# Patient Record
Sex: Male | Born: 1985 | Hispanic: Yes | Marital: Single | Smoking: Never smoker
Health system: Southern US, Community
[De-identification: ages and names within clinical notes are randomized; demographics above are authoritative.]

---

## 2008-02-16 ENCOUNTER — Inpatient Hospital Stay (HOSPITAL_COMMUNITY): Admission: EM | Admit: 2008-02-16 | Discharge: 2008-02-26 | Payer: Self-pay | Admitting: Emergency Medicine

## 2008-02-16 ENCOUNTER — Ambulatory Visit: Payer: Self-pay | Admitting: Internal Medicine

## 2008-02-16 ENCOUNTER — Ambulatory Visit: Payer: Self-pay | Admitting: Pulmonary Disease

## 2008-03-11 ENCOUNTER — Ambulatory Visit: Payer: Self-pay | Admitting: Pulmonary Disease

## 2008-03-11 DIAGNOSIS — J1100 Influenza due to unidentified influenza virus with unspecified type of pneumonia: Secondary | ICD-10-CM

## 2009-05-09 IMAGING — CR DG CHEST 2V
2 series · 2 of 2 positions shown · non-contrast
Comparison: 02/23/2008 and CT of 02/16/2008.

CLINICAL DATA: Follow up pneumonia

CHEST - 2 VIEW

[w chest pa]
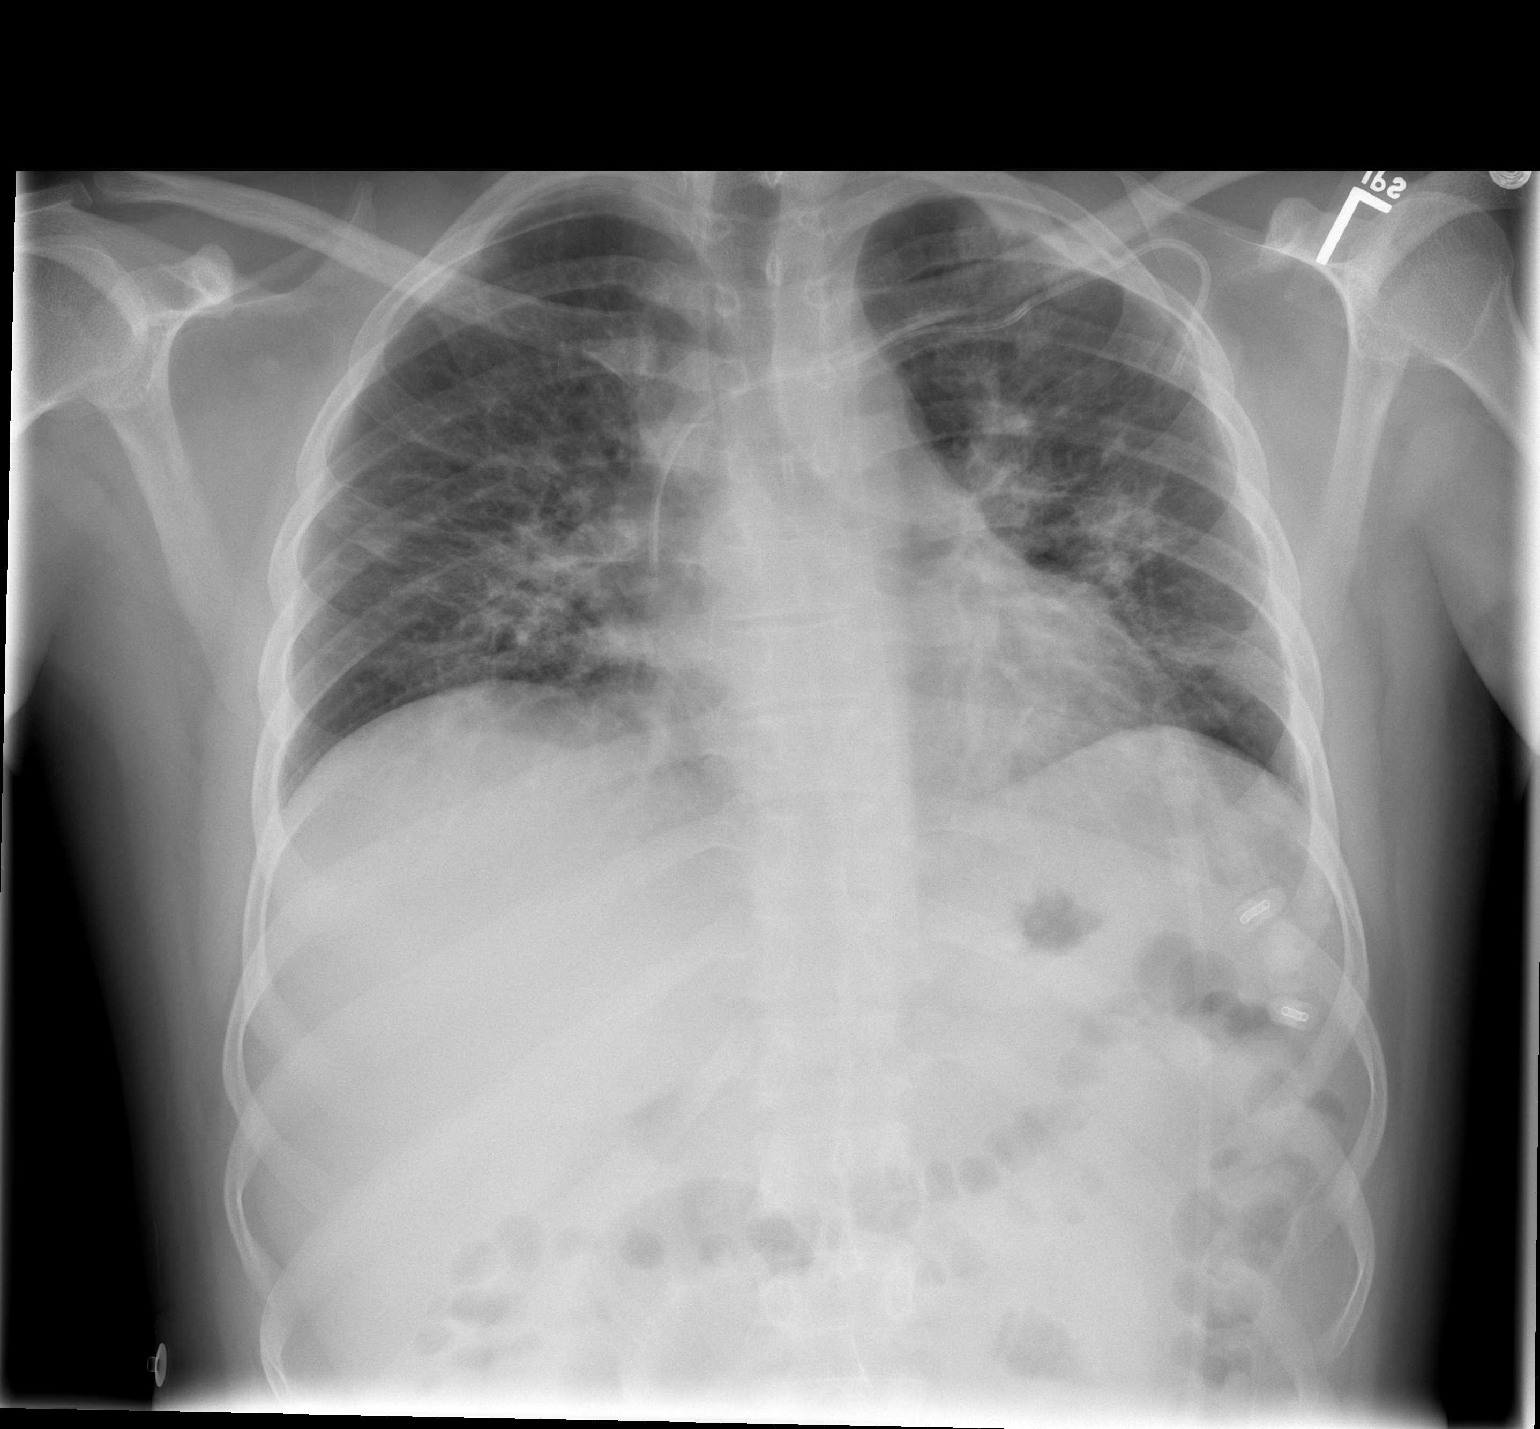

[w chest lat]
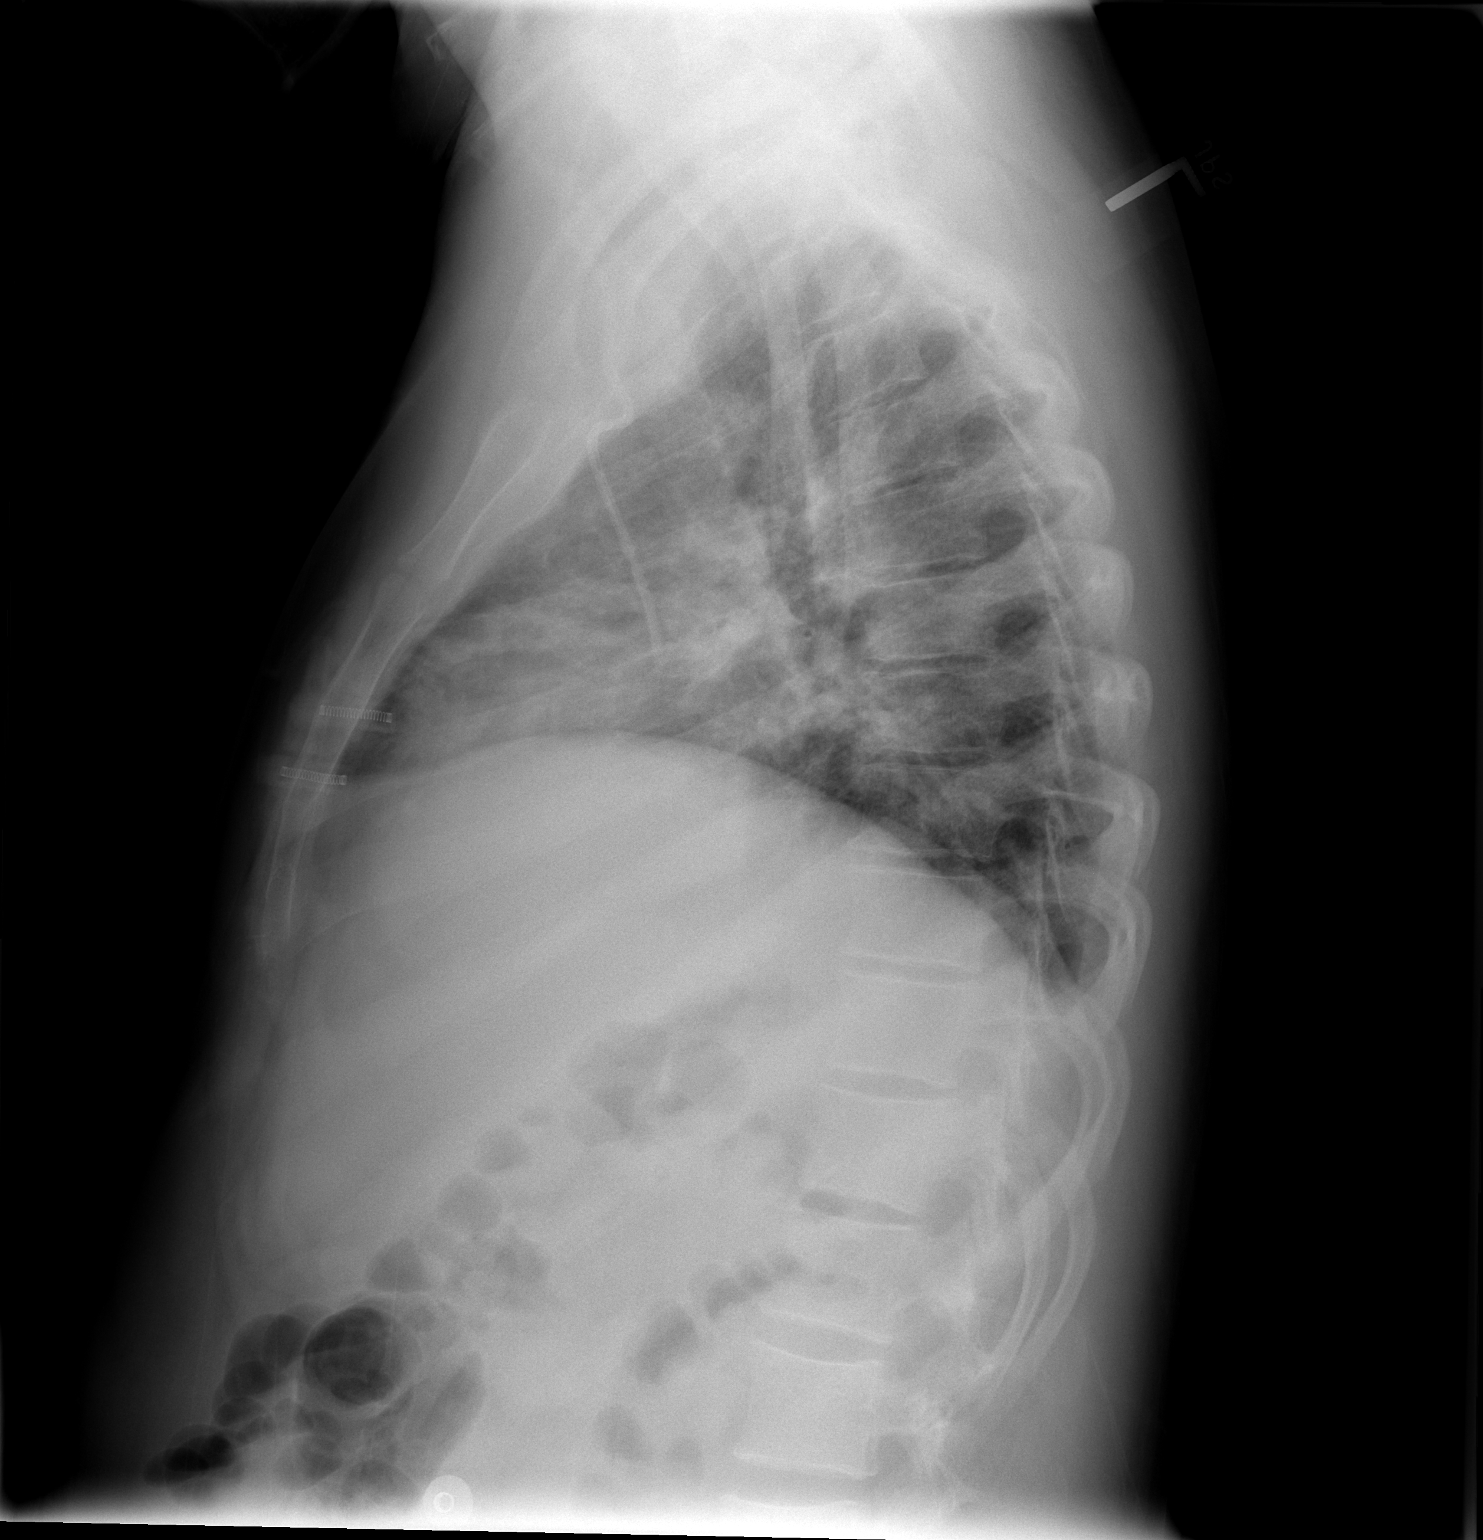

[2 of 2 positions shown; findings below may reference images not displayed]

FINDINGS: Left-sided subclavian line terminates at mid SVC. Midline
trachea. Normal heart size for level of inspiration.  No pleural
effusion or pneumothorax. Left apical pleural thickening.  Improved
aeration.  Perihilar interstitial and airspace disease persists.
IMPRESSION: 1.  Improved aeration with persistent perihilar interstitial and
airspace disease.  Given the appearance on prior CT, favored
etiologies include atypical infectious processes.  Correlate with
patient's immune status.

## 2010-09-14 NOTE — H&P (Signed)
NAME:  Micheal Ortiz, PETROVICH NO.:  [id number]   MEDICAL RECORD NO.:  [id number]          PATIENT TYPE:  EMS   LOCATION:  MAJO                         FACILITY:  [facility]   PHYSICIAN:  Eduard Clos, MDDATE OF BIRTH:  29-Aug-1985   DATE OF ADMISSION:  02/15/2008  DATE OF DISCHARGE:                              HISTORY & PHYSICAL   PRIMARY CARE PHYSICIAN:  Unassigned.   History obtained through his wife, acting as Engineer, structural.   CHIEF COMPLAINT:  Shortness of breath.   HISTORY OF PRESENT ILLNESS:  A 25 year old male with no significant past  medical history presented to the ER with increasing shortness of breath  over the last 10 days.  Patient has been having initially coughing  spells with nausea and vomiting with eventually becoming more and more  short of breath over the last three days.  Patient also has been having  cough with productive sputum.  In the ER, the patient did have some  hemoptysis.  In the ER when the patient was admitted, he was found to  have a chest x-ray with bilateral infiltrates.  Patient has been  admitted for further management.  Patient also has been experiencing  fever and chills with abdominal pain, particularly in the right lower  quadrant.  Denies any diarrhea.  Denies any chest pain.  Patient at this  time is on 4 liters of oxygen, maintaining sats of more than 90.  He  denies any dysuria, weakness of limbs, or loss of consciousness.   PAST MEDICAL HISTORY:  Nothing significant.   PAST SURGICAL HISTORY:  None.   MEDICATIONS PRIOR TO ADMISSION:  Patient was recently taking Duraflu.   ALLERGIES:  No known drug allergies.   SOCIAL HISTORY:  Patient denies smoking cigarettes.  Drinks alcohol  occasionally.  Denies any drug abuse.   REVIEW OF SYSTEMS:  As per the history of present illness.  Nothing else  significant.   PHYSICAL EXAMINATION:  Patient examined at bedside.  Not in acute  distress, although appears mildly  short of breath.  VITAL SIGNS:  Blood pressure is 198/78, pulse 98 per minute, temperature  102.7 maximum, respirations 20 per minute, O2 sat 97% on 4 liters.  HEENT:  Anicteric.  No pallor.  CHEST:  Bilateral air entry present bilaterally.  Coarse crepitation.  HEART:  S1 and S2 heard.  ABDOMEN:  Soft.  Nontender.  Bowel sounds heard.  No guarding, no  rigidity.  CNS:  Awake, alert and oriented to time, place, and person.  No focal  deficits.  EXTREMITIES:  Peripheral pulses.  No edema.   LABS:  CBC:  WBC is 6.4, hemoglobin 16, hematocrit 45.8, platelets 94.  Basic metabolic panel:  Sodium 133, potassium 3.1, chloride 100, carbon  dioxide 24, glucose 143, BUN 8, creatinine 0.64, calcium 8.4.  Glucose  is pending.   CHEST X-RAY:  Patchy bilateral air space opacities, concerning for  multifocal bacterial pneumonia or viral pneumonia.  Suggest radiographic  followup.   ASSESSMENT:  1. Acute respiratory failure secondary to pneumonia.  2. Bilateral pneumonia.  3. Hemoptysis.  4. Nausea and  vomiting with abdominal pain.   PLAN:  Admit patient to step-down unit for closer monitoring.  Will get  an ABG.  Start patient on IV antibiotics.  Will get a CT of the chest,  abdomen, and pelvis with contrast.  Place PPD.  Sputum for AFB.  Respiratory and droplet isolation.  Will also place the patient on  Tamiflu.  Further recommendations as patient's condition evolves.  Patient probably will need a pulmonary consult.      Eduard Clos, MD  Electronically Signed     ANK/MEDQ  D:  02/16/2008  T:  02/16/2008  Job:  [id number]

## 2010-09-17 NOTE — Discharge Summary (Signed)
NAMESELAH, ZELMAN             ACCOUNT NO.:  [id number]   MEDICAL RECORD NO.:  [id number]          PATIENT TYPE:  INP   LOCATION:  4728                         FACILITY:  [facility]   PHYSICIAN:  Charlaine Dalton. Sherene Sires, MD, FCCPDATE OF BIRTH:  06/08/1985   DATE OF ADMISSION:  02/15/2008  DATE OF DISCHARGE:  02/26/2008                               DISCHARGE SUMMARY   DISCHARGE DIAGNOSES:  1. Acute respiratory failure, requiring ventilatory support.  2. Agitation.  3. Pneumonia.  4. Hypokalemia and hyponatremia.  5. Diarrhea, Clostridium difficile negative.   HISTORY OF PRESENT ILLNESS:  Micheal Ortiz is a 25 year old Hispanic male  with no significant past medical history.  He presented to the emergency  department on February 15, 2008 with increasing shortness of breath over  10 days.  He was initially having coughing spells with nausea and  vomiting and eventually becoming more and more short of breath over the  last 72 hours prior to admission.  He was also noted to have productive  sputum that was noted to be purulent in nature.  He was admitted for  further evaluation and treatment.  Originally he was admitted by the  [facility] service and pulmonary critical care assumed the care once he  required orotracheal intubation with mechanical ventilatory support.   LABORATORY DATA:  WBC 8.4, hemoglobin 12.6, hematocrit 37.3, platelet  count 111,000.  His admitting WBC was 12.6, hemoglobin 11.8, hematocrit  34.  Platelets 381,000.  ESR 2.  INR 1.0, PTT 28.  Sodium reached a  peak of 149, potassium 3.5, chloride 115, CO2 28, glucose 169, BUN 10.  Creatinine 1.07.   HOSPITAL COURSE:  1. Acute respiratory failure.  Note that he had a history of asthma      and initially noted to have wheezing.  He required orotracheal      intubation on February 16, 2008 with successful weaning of      mechanical ventilatory support until October 22.  He had a presumed      underlying pneumonia although his  H1N1 was negative.  AFB smear was      negative.  He completed seven days of antimicrobial therapy with      Zosyn.  He was initially placed on vancomycin and Zosyn and      Zithromax and ceftriaxone,  but he completed Zosyn, vancomycin and      ceftriaxone and Zithromax empiric rx.  He also had a left IJ      subclavian central venous line placed on October 17 with subsequent      removal; placed on October 17, removal on October 25.  He reached      maximal hospital benefit by February 26, 2008 and was discharged      home.  2. Pneumonia.  Again organism was not specified.  3. Hypokalemia which was resolved with treatment.  4. Hyponatremia which was resolved with treatment.  5. Diarrhea.  He was Clostridium difficile negative.  There was      question if it was from Protonix.  He was changed to Pepcid with  resolution.   DISCHARGE MEDICATIONS:  1. Symbicort 164.5 two puffs a.m. and p.m.  2. Magic Mouth Wash two tablespoons swish and gargle q.i.d.   FOLLOW UP:  He has a followup appointment with Dr. Cyril Mourning November  10.   DIET:  Heart-healthy diet.   DISPOSITION ON DISCHARGE:  Improved.      Devra Dopp, MSN, ACNP      Charlaine Dalton. Sherene Sires, MD, [facility]  Electronically Signed    SM/MEDQ  D:  05/06/2008  T:  05/06/2008  Job:  [id number]

## 2011-02-01 LAB — GLUCOSE, CAPILLARY
Glucose-Capillary: 110 mg/dL — ABNORMAL HIGH (ref 70–99)
Glucose-Capillary: 111 mg/dL — ABNORMAL HIGH (ref 70–99)
Glucose-Capillary: 120 mg/dL — ABNORMAL HIGH (ref 70–99)
Glucose-Capillary: 122 mg/dL — ABNORMAL HIGH (ref 70–99)
Glucose-Capillary: 123 mg/dL — ABNORMAL HIGH (ref 70–99)
Glucose-Capillary: 138 mg/dL — ABNORMAL HIGH (ref 70–99)
Glucose-Capillary: 142 mg/dL — ABNORMAL HIGH (ref 70–99)
Glucose-Capillary: 143 mg/dL — ABNORMAL HIGH (ref 70–99)
Glucose-Capillary: 145 mg/dL — ABNORMAL HIGH (ref 70–99)
Glucose-Capillary: 149 mg/dL — ABNORMAL HIGH (ref 70–99)
Glucose-Capillary: 150 mg/dL — ABNORMAL HIGH (ref 70–99)
Glucose-Capillary: 163 mg/dL — ABNORMAL HIGH (ref 70–99)
Glucose-Capillary: 165 mg/dL — ABNORMAL HIGH (ref 70–99)
Glucose-Capillary: 172 mg/dL — ABNORMAL HIGH (ref 70–99)
Glucose-Capillary: 176 mg/dL — ABNORMAL HIGH (ref 70–99)
Glucose-Capillary: 196 mg/dL — ABNORMAL HIGH (ref 70–99)
Glucose-Capillary: 196 mg/dL — ABNORMAL HIGH (ref 70–99)
Glucose-Capillary: 220 mg/dL — ABNORMAL HIGH (ref 70–99)
Glucose-Capillary: 220 mg/dL — ABNORMAL HIGH (ref 70–99)
Glucose-Capillary: 74 mg/dL (ref 70–99)

## 2011-02-01 LAB — BLOOD GAS, ARTERIAL
Acid-Base Excess: 0.1 mmol/L (ref 0.0–2.0)
Acid-base deficit: 2.7 mmol/L — ABNORMAL HIGH (ref 0.0–2.0)
Acid-base deficit: 3.7 mmol/L — ABNORMAL HIGH (ref 0.0–2.0)
Acid-base deficit: 4.5 mmol/L — ABNORMAL HIGH (ref 0.0–2.0)
Acid-base deficit: 5.3 mmol/L — ABNORMAL HIGH (ref 0.0–2.0)
Bicarbonate: 20.5 mEq/L (ref 20.0–24.0)
Bicarbonate: 20.9 mEq/L (ref 20.0–24.0)
Bicarbonate: 21.1 mEq/L (ref 20.0–24.0)
Bicarbonate: 21.7 mEq/L (ref 20.0–24.0)
Bicarbonate: 24.2 mEq/L — ABNORMAL HIGH (ref 20.0–24.0)
Bicarbonate: 26.8 mEq/L — ABNORMAL HIGH (ref 20.0–24.0)
Drawn by: 129711
Drawn by: 13898
Drawn by: 290171
FIO2: 0.3 %
FIO2: 0.3 %
FIO2: 0.4 %
FIO2: 0.4 %
FIO2: 1 %
FIO2: 100 %
MECHVT: 450 mL
MECHVT: 450 mL
MECHVT: 450 mL
Mode: POSITIVE
O2 Saturation: 94.9 %
O2 Saturation: 98.8 %
O2 Saturation: 99 %
PEEP: 5 cmH2O
Patient temperature: 100.2
Patient temperature: 98.6
Patient temperature: 98.6
Patient temperature: 98.6
Pressure support: 12 cmH2O
RATE: 16 resp/min
RATE: 20 resp/min
TCO2: 21.8 mmol/L (ref 0–100)
TCO2: 22.3 mmol/L (ref 0–100)
TCO2: 23.1 mmol/L (ref 0–100)
TCO2: 27.5 mmol/L (ref 0–100)
TCO2: 28 mmol/L (ref 0–100)
pCO2 arterial: 32 mmHg — ABNORMAL LOW (ref 35.0–45.0)
pCO2 arterial: 34.6 mmHg — ABNORMAL LOW (ref 35.0–45.0)
pCO2 arterial: 35.2 mmHg (ref 35.0–45.0)
pCO2 arterial: 38.7 mmHg (ref 35.0–45.0)
pCO2 arterial: 39.7 mmHg (ref 35.0–45.0)
pCO2 arterial: 41.7 mmHg (ref 35.0–45.0)
pCO2 arterial: 42.8 mmHg (ref 35.0–45.0)
pH, Arterial: 7.327 — ABNORMAL LOW (ref 7.350–7.450)
pH, Arterial: 7.329 — ABNORMAL LOW (ref 7.350–7.450)
pH, Arterial: 7.362 (ref 7.350–7.450)
pH, Arterial: 7.437 (ref 7.350–7.450)
pH, Arterial: 7.444 (ref 7.350–7.450)
pH, Arterial: 7.446 (ref 7.350–7.450)
pH, Arterial: 7.497 — ABNORMAL HIGH (ref 7.350–7.450)
pO2, Arterial: 102 mmHg — ABNORMAL HIGH (ref 80.0–100.0)
pO2, Arterial: 302 mmHg — ABNORMAL HIGH (ref 80.0–100.0)
pO2, Arterial: 69.1 mmHg — ABNORMAL LOW (ref 80.0–100.0)

## 2011-02-01 LAB — BASIC METABOLIC PANEL
BUN: 10 mg/dL (ref 6–23)
BUN: 10 mg/dL (ref 6–23)
BUN: 11 mg/dL (ref 6–23)
BUN: 9 mg/dL (ref 6–23)
CO2: 22 mEq/L (ref 19–32)
CO2: 22 mEq/L (ref 19–32)
CO2: 24 mEq/L (ref 19–32)
CO2: 25 mEq/L (ref 19–32)
CO2: 27 mEq/L (ref 19–32)
CO2: 28 mEq/L (ref 19–32)
Calcium: 7.5 mg/dL — ABNORMAL LOW (ref 8.4–10.5)
Calcium: 7.7 mg/dL — ABNORMAL LOW (ref 8.4–10.5)
Calcium: 8.4 mg/dL (ref 8.4–10.5)
Calcium: 8.4 mg/dL (ref 8.4–10.5)
Calcium: 8.4 mg/dL (ref 8.4–10.5)
Chloride: 105 mEq/L (ref 96–112)
Chloride: 105 mEq/L (ref 96–112)
Chloride: 109 mEq/L (ref 96–112)
Chloride: 111 mEq/L (ref 96–112)
Chloride: 115 mEq/L — ABNORMAL HIGH (ref 96–112)
Creatinine, Ser: 0.61 mg/dL (ref 0.4–1.5)
Creatinine, Ser: 0.85 mg/dL (ref 0.4–1.5)
Creatinine, Ser: 1.01 mg/dL (ref 0.4–1.5)
GFR calc Af Amer: >60 mL/min (ref >60)
GFR calc Af Amer: >60 mL/min (ref >60)
GFR calc Af Amer: >60 mL/min (ref >60)
GFR calc Af Amer: >60 mL/min (ref >60)
GFR calc Af Amer: >60 mL/min (ref >60)
GFR calc Af Amer: >60 mL/min (ref >60)
GFR calc non Af Amer: >60 mL/min (ref >60)
GFR calc non Af Amer: >60 mL/min (ref >60)
GFR calc non Af Amer: >60 mL/min (ref >60)
Glucose, Bld: 114 mg/dL — ABNORMAL HIGH (ref 70–99)
Glucose, Bld: 143 mg/dL — ABNORMAL HIGH (ref 70–99)
Glucose, Bld: 152 mg/dL — ABNORMAL HIGH (ref 70–99)
Glucose, Bld: 169 mg/dL — ABNORMAL HIGH (ref 70–99)
Potassium: 3.1 mEq/L — ABNORMAL LOW (ref 3.5–5.1)
Potassium: 3.3 mEq/L — ABNORMAL LOW (ref 3.5–5.1)
Potassium: 3.3 mEq/L — ABNORMAL LOW (ref 3.5–5.1)
Potassium: 3.5 mEq/L (ref 3.5–5.1)
Potassium: 3.6 mEq/L (ref 3.5–5.1)
Potassium: 4.3 mEq/L (ref 3.5–5.1)
Sodium: 141 mEq/L (ref 135–145)
Sodium: 142 mEq/L (ref 135–145)
Sodium: 147 mEq/L — ABNORMAL HIGH (ref 135–145)

## 2011-02-01 LAB — CBC
HCT: 33.7 % — ABNORMAL LOW (ref 39.0–52.0)
HCT: 34.3 % — ABNORMAL LOW (ref 39.0–52.0)
HCT: 35.9 % — ABNORMAL LOW (ref 39.0–52.0)
HCT: 36.2 % — ABNORMAL LOW (ref 39.0–52.0)
HCT: 43 % (ref 39.0–52.0)
HCT: 45.8 % (ref 39.0–52.0)
Hemoglobin: 11.8 g/dL — ABNORMAL LOW (ref 13.0–17.0)
Hemoglobin: 11.8 g/dL — ABNORMAL LOW (ref 13.0–17.0)
Hemoglobin: 12.3 g/dL — ABNORMAL LOW (ref 13.0–17.0)
Hemoglobin: 12.4 g/dL — ABNORMAL LOW (ref 13.0–17.0)
Hemoglobin: 16 g/dL (ref 13.0–17.0)
MCHC: 33.5 g/dL (ref 30.0–36.0)
MCHC: 33.6 g/dL (ref 30.0–36.0)
MCHC: 34 g/dL (ref 30.0–36.0)
MCHC: 34.6 g/dL (ref 30.0–36.0)
MCHC: 34.7 g/dL (ref 30.0–36.0)
MCV: 87.1 fL (ref 78.0–100.0)
MCV: 87.4 fL (ref 78.0–100.0)
MCV: 87.6 fL (ref 78.0–100.0)
MCV: 87.6 fL (ref 78.0–100.0)
MCV: 88.4 fL (ref 78.0–100.0)
MCV: 88.8 fL (ref 78.0–100.0)
Platelets: 111 10*3/uL — ABNORMAL LOW (ref 150–400)
Platelets: 218 10*3/uL (ref 150–400)
Platelets: 346 10*3/uL (ref 150–400)
Platelets: 368 10*3/uL (ref 150–400)
Platelets: 96 10*3/uL — ABNORMAL LOW (ref 150–400)
RBC: 3.81 MIL/uL — ABNORMAL LOW (ref 4.22–5.81)
RBC: 3.89 MIL/uL — ABNORMAL LOW (ref 4.22–5.81)
RBC: 4.1 MIL/uL — ABNORMAL LOW (ref 4.22–5.81)
RBC: 4.12 MIL/uL — ABNORMAL LOW (ref 4.22–5.81)
RBC: 4.23 MIL/uL (ref 4.22–5.81)
RDW: 12.5 % (ref 11.5–15.5)
RDW: 12.5 % (ref 11.5–15.5)
RDW: 13.4 % (ref 11.5–15.5)
RDW: 13.5 % (ref 11.5–15.5)
RDW: 13.6 % (ref 11.5–15.5)
WBC: 10.9 10*3/uL — ABNORMAL HIGH (ref 4.0–10.5)
WBC: 15.5 10*3/uL — ABNORMAL HIGH (ref 4.0–10.5)
WBC: 6.4 10*3/uL (ref 4.0–10.5)
WBC: 8 10*3/uL (ref 4.0–10.5)
WBC: 8.9 10*3/uL (ref 4.0–10.5)

## 2011-02-01 LAB — URINALYSIS, ROUTINE W REFLEX MICROSCOPIC
Bilirubin Urine: NEGATIVE
Ketones, ur: 40 mg/dL — AB
Nitrite: NEGATIVE
Protein, ur: >300 mg/dL — AB
Specific Gravity, Urine: 1.034 — ABNORMAL HIGH (ref 1.005–1.030)
Urobilinogen, UA: 0.2 mg/dL (ref 0.0–1.0)

## 2011-02-01 LAB — URINE MICROSCOPIC-ADD ON

## 2011-02-01 LAB — URINE CULTURE
Colony Count: NO GROWTH
Culture: NO GROWTH
Special Requests: NEGATIVE

## 2011-02-01 LAB — PROTIME-INR
INR: 1 (ref 0.00–1.49)
Prothrombin Time: 13.4 seconds (ref 11.6–15.2)

## 2011-02-01 LAB — DIFFERENTIAL
Basophils Relative: 0 % (ref 0–1)
Blasts: 0 %
Lymphocytes Relative: 10 % — ABNORMAL LOW (ref 12–46)
Lymphocytes Relative: 14 % (ref 12–46)
Monocytes Absolute: 0.2 10*3/uL (ref 0.1–1.0)
Monocytes Relative: 3 % (ref 3–12)
Myelocytes: 0 %
Neutro Abs: 5.4 10*3/uL (ref 1.7–7.7)
Promyelocytes Absolute: 0 %
nRBC: 0 /100 WBC

## 2011-02-01 LAB — CULTURE, BLOOD (ROUTINE X 2)
Culture: NO GROWTH
Culture: NO GROWTH
Culture: NO GROWTH

## 2011-02-01 LAB — CLOSTRIDIUM DIFFICILE EIA: C difficile Toxins A+B, EIA: NEGATIVE

## 2011-02-01 LAB — CULTURE, RESPIRATORY W GRAM STAIN

## 2011-02-01 LAB — AFB CULTURE WITH SMEAR (NOT AT ARMC)
Acid Fast Smear: NONE SEEN
Acid Fast Smear: NONE SEEN
Acid Fast Smear: NONE SEEN

## 2011-02-01 LAB — COMPREHENSIVE METABOLIC PANEL
Albumin: 3 g/dL — ABNORMAL LOW (ref 3.5–5.2)
Alkaline Phosphatase: 71 U/L (ref 39–117)
BUN: 5 mg/dL — ABNORMAL LOW (ref 6–23)
Potassium: 4.1 mEq/L (ref 3.5–5.1)
Total Protein: 5.9 g/dL — ABNORMAL LOW (ref 6.0–8.3)

## 2011-02-01 LAB — CARDIAC PANEL(CRET KIN+CKTOT+MB+TROPI)
CK, MB: 1.4 ng/mL (ref 0.3–4.0)
Relative Index: 0.6 (ref 0.0–2.5)
Troponin I: 0.01 ng/mL (ref 0.00–0.06)

## 2011-02-01 LAB — PHOSPHORUS: Phosphorus: 2.8 mg/dL (ref 2.3–4.6)

## 2011-02-01 LAB — P CARINII SMEAR DFA: Pneumocystis carinii DFA: NEGATIVE

## 2011-02-01 LAB — VANCOMYCIN, TROUGH: Vancomycin Tr: 5.4 ug/mL — ABNORMAL LOW (ref 10.0–20.0)

## 2011-02-01 LAB — POCT I-STAT 3, ART BLOOD GAS (G3+)
Patient temperature: 98.8
pCO2 arterial: 36 mmHg (ref 35.0–45.0)

## 2011-02-01 LAB — H1N1 SCREEN (PCR): H1N1 Virus Scrn: NOT DETECTED

## 2011-02-01 LAB — VIRUS CULTURE: Preliminary Culture: NEGATIVE

## 2011-02-01 LAB — HIV ANTIBODY (ROUTINE TESTING W REFLEX): HIV: NONREACTIVE

## 2011-02-01 LAB — APTT: aPTT: 28 seconds (ref 24–37)

## 2011-02-01 LAB — MAGNESIUM: Magnesium: 1.9 mg/dL (ref 1.5–2.5)

## 2017-05-02 ENCOUNTER — Other Ambulatory Visit: Payer: Self-pay

## 2017-05-02 ENCOUNTER — Encounter (HOSPITAL_COMMUNITY): Payer: Self-pay | Admitting: Emergency Medicine

## 2017-05-02 ENCOUNTER — Ambulatory Visit (INDEPENDENT_AMBULATORY_CARE_PROVIDER_SITE_OTHER): Payer: Self-pay

## 2017-05-02 ENCOUNTER — Ambulatory Visit (HOSPITAL_COMMUNITY)
Admission: EM | Admit: 2017-05-02 | Discharge: 2017-05-02 | Disposition: A | Discharge status: Home / Self Care | Payer: Self-pay | Attending: Family Medicine | Admitting: Family Medicine

## 2017-05-02 DIAGNOSIS — W19XXXA Unspecified fall, initial encounter: Secondary | ICD-10-CM

## 2017-05-02 DIAGNOSIS — M5489 Other dorsalgia: Secondary | ICD-10-CM

## 2017-05-02 DIAGNOSIS — M533 Sacrococcygeal disorders, not elsewhere classified: Secondary | ICD-10-CM

## 2017-05-02 DIAGNOSIS — W010XXA Fall on same level from slipping, tripping and stumbling without subsequent striking against object, initial encounter: Secondary | ICD-10-CM

## 2017-05-02 NOTE — ED Triage Notes (Signed)
Fell Sunday night.  Was cleaning floor with soap, slipped and fell on buttocks.  Pain in coccyx area.  No pain into either leg.  No issues with urination.  No bladder or bowel incontinence.

## 2017-05-02 NOTE — Discharge Instructions (Addendum)
Continue Ibuprofen 800 mg cada 8 horas con comida. Tambien [place] Tylenol [phone] tambien.  Puede [place] una almuada de dona para centarse.  Si su sintomas no mejoran en 1-2 semanas, regrese por favor o sus sintomas empeoran.

## 2017-05-02 NOTE — ED Provider Notes (Signed)
[facility]    CSN: [id number] Arrival date & time: 05/02/17  1704     History   Chief Complaint Chief Complaint  Patient presents with  . Fall    HPI Interpretation via Maggy. Mahki Spikes is a 32 y.o. male presenting after a fall with tail bone pain. He works in [employer] and slipped and fell after the floor was cleaned, he fell onto his tailbone. This happened Sunday night, yesterday morning he had difficulty getting out of bed due to pain. Worse with sitting and laying on sofa, better when standing up, no problems with walking. Taking Ibuprofen and Flanax, improved providing some relief. Urination and defecation without issue. Mild cramping in right leg, pins and needle sensation in bottom of feet.   HPI  History reviewed. No pertinent past medical history.  Patient Active Problem List   Diagnosis Date Noted  . INFLUENZA W/PNEUMONIA 03/11/2008    History reviewed. No pertinent surgical history.     Home Medications    Prior to Admission medications   Medication Sig Start Date End Date Taking? Authorizing Provider  ibuprofen (ADVIL,MOTRIN) 200 MG tablet Take 200 mg by mouth every 6 (six) hours as needed.   Yes [provider]  Naproxen-Liniment [facility] PAIN RELIEF [facility]) by Combination route.   Yes [provider]  NON FORMULARY    Yes [provider]    Family History Family History  Problem Relation Age of Onset  . Healthy Mother     Social History Social History   Tobacco Use  . Smoking status: Never Smoker  Substance Use Topics  . Alcohol use: Yes  . Drug use: No     Allergies   Patient has no known allergies.   Review of Systems Review of Systems  Respiratory: Negative for shortness of breath.   Cardiovascular: Negative for chest pain.  Gastrointestinal: Negative for abdominal pain and constipation.  Genitourinary: Negative for difficulty urinating.  Musculoskeletal: Positive for back pain and  myalgias. Negative for gait problem and neck pain.  Skin: Negative for wound.  Neurological: Negative for dizziness, weakness, light-headedness and headaches.     Physical Exam Triage Vital Signs ED Triage Vitals  Enc Vitals Group     BP 05/02/17 1802 (!) 153/104     Pulse Rate 05/02/17 1802 72     Resp 05/02/17 1802 18     Temp 05/02/17 1802 98.4 F (36.9 C)     Temp Source 05/02/17 1802 Oral     SpO2 05/02/17 1802 100 %     Weight --      Height --      Head Circumference --      Peak Flow --      Pain Score 05/02/17 1757 9     Pain Loc --      Pain Edu? --      Excl. in GC? --    No data found.  Updated Vital Signs BP (!) 153/104 (BP Location: Left Arm)   Pulse 72   Temp 98.4 F (36.9 C) (Oral)   Resp 18   SpO2 100%    Physical Exam  Constitutional: He appears well-developed and well-nourished.  Patient standing to avoid pain with sitting  HENT:  Head: Normocephalic and atraumatic.  Eyes: Conjunctivae are normal.  Neck: Neck supple.  Cardiovascular: Normal rate and regular rhythm.  No murmur heard. Pulmonary/Chest: Effort normal. No respiratory distress.  Abdominal: Soft. He exhibits no distension.  Musculoskeletal: He exhibits  tenderness. He exhibits no edema.  Tenderness with light palpation to sacral area. No tenderness to lumbar muscles/spine.  Neurological: He is alert.  Skin: Skin is warm and dry.  Psychiatric: He has a normal mood and affect.  Nursing note and vitals reviewed.    UC Treatments / Results  Labs (all labs ordered are listed, but only abnormal results are displayed) Labs Reviewed - No data to display  EKG  EKG Interpretation None       Radiology Dg Sacrum/coccyx  Result Date: 05/02/2017 CLINICAL DATA:  Fall injuring tail bone. EXAM: SACRUM AND COCCYX - 2+ VIEW COMPARISON:  02/21/08 FINDINGS: There is no evidence of fracture or other focal bone lesions. IMPRESSION: Negative. Electronically Signed   By: Signa Kellaylor  Stroud M.D.    On: 05/02/2017 18:52    Procedures Procedures (including critical care time)  Medications Ordered in UC Medications - No data to display   Initial Impression / Assessment and Plan / UC Course  I have reviewed the triage vital signs and the nursing notes.  Pertinent labs & imaging results that were available during my care of the patient were reviewed by me and considered in my medical decision making (see chart for details).    No fracture on imaging, advised to continue anti-inflammatories, may use donut pillow for pain with sitting to relieve pressure. Advised symptoms often worsen in first 2-3 days then followed by improvement. Discussed strict return precautions. Patient verbalized understanding and is agreeable with plan.    Final Clinical Impressions(s) / UC Diagnoses   Final diagnoses:  Fall, initial encounter  Tail bone pain    ED Discharge Orders    None       Controlled Substance Prescriptions [place] Controlled Substance Registry consulted? Not Applicable   Lew DawesWieters, Tami Barren C, [place] JerseyPA-C 05/02/17 1945

## 2018-07-14 IMAGING — DX DG SACRUM/COCCYX 2+V
3 series · 3 of 3 positions shown · non-contrast
Comparison: 02/21/08

CLINICAL DATA: Fall injuring tail bone.

EXAM:
SACRUM AND COCCYX - 2+ VIEW

[coccyx ap]
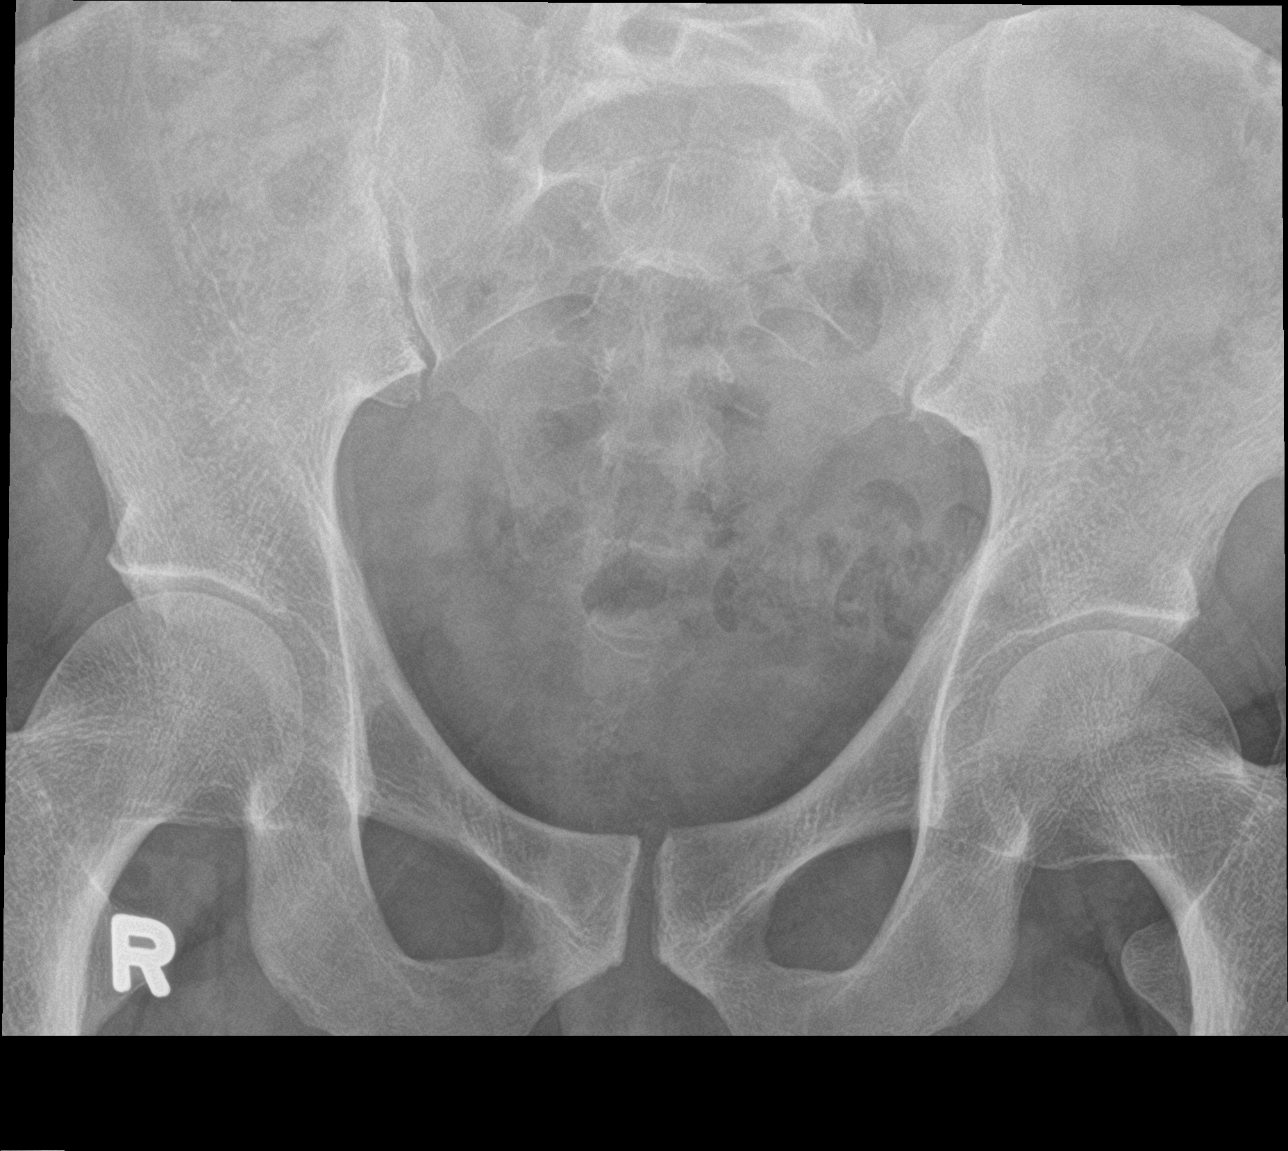

[sacrum ap]
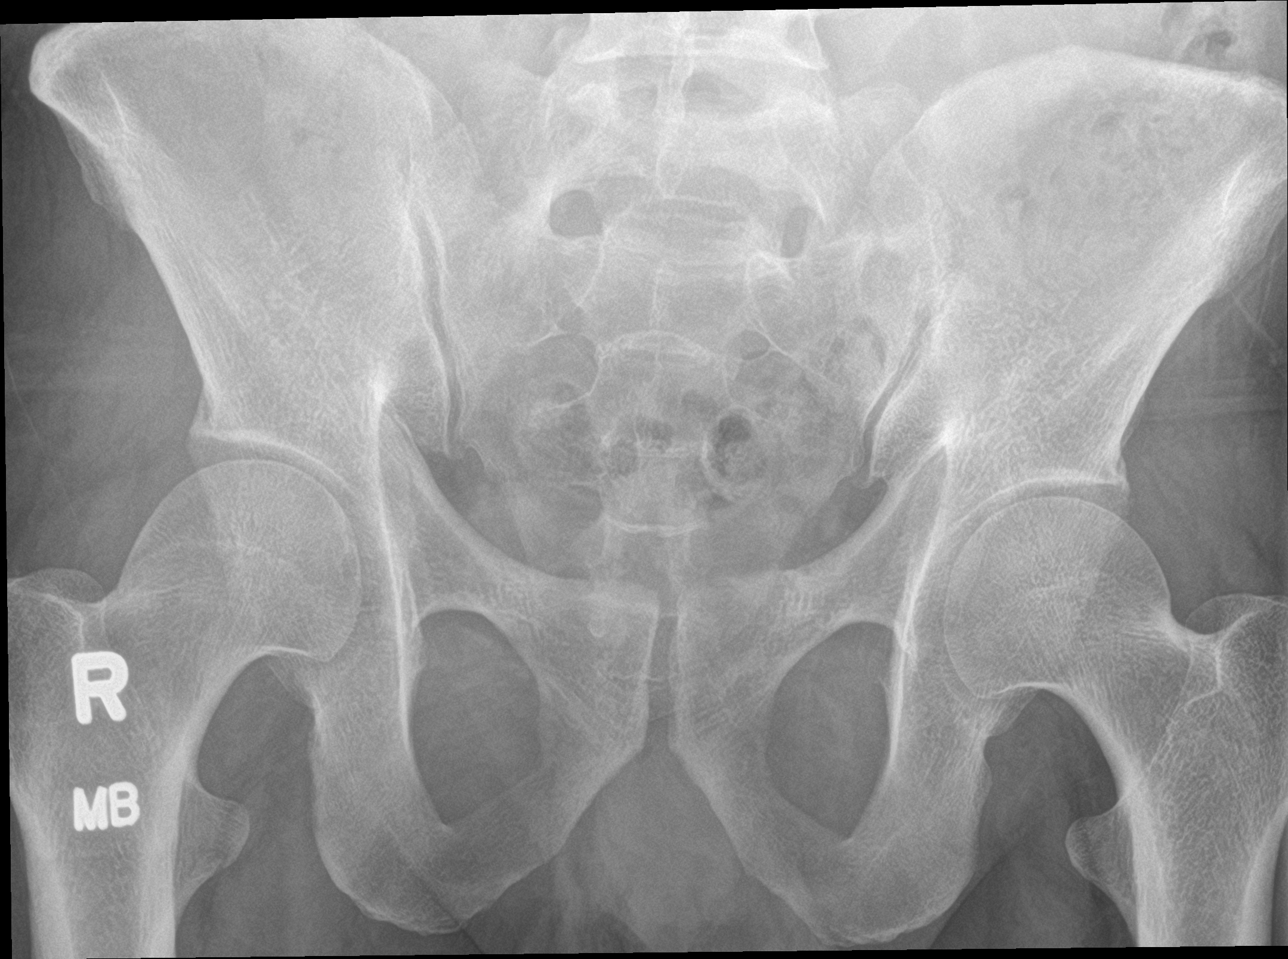

[sacrum lat]
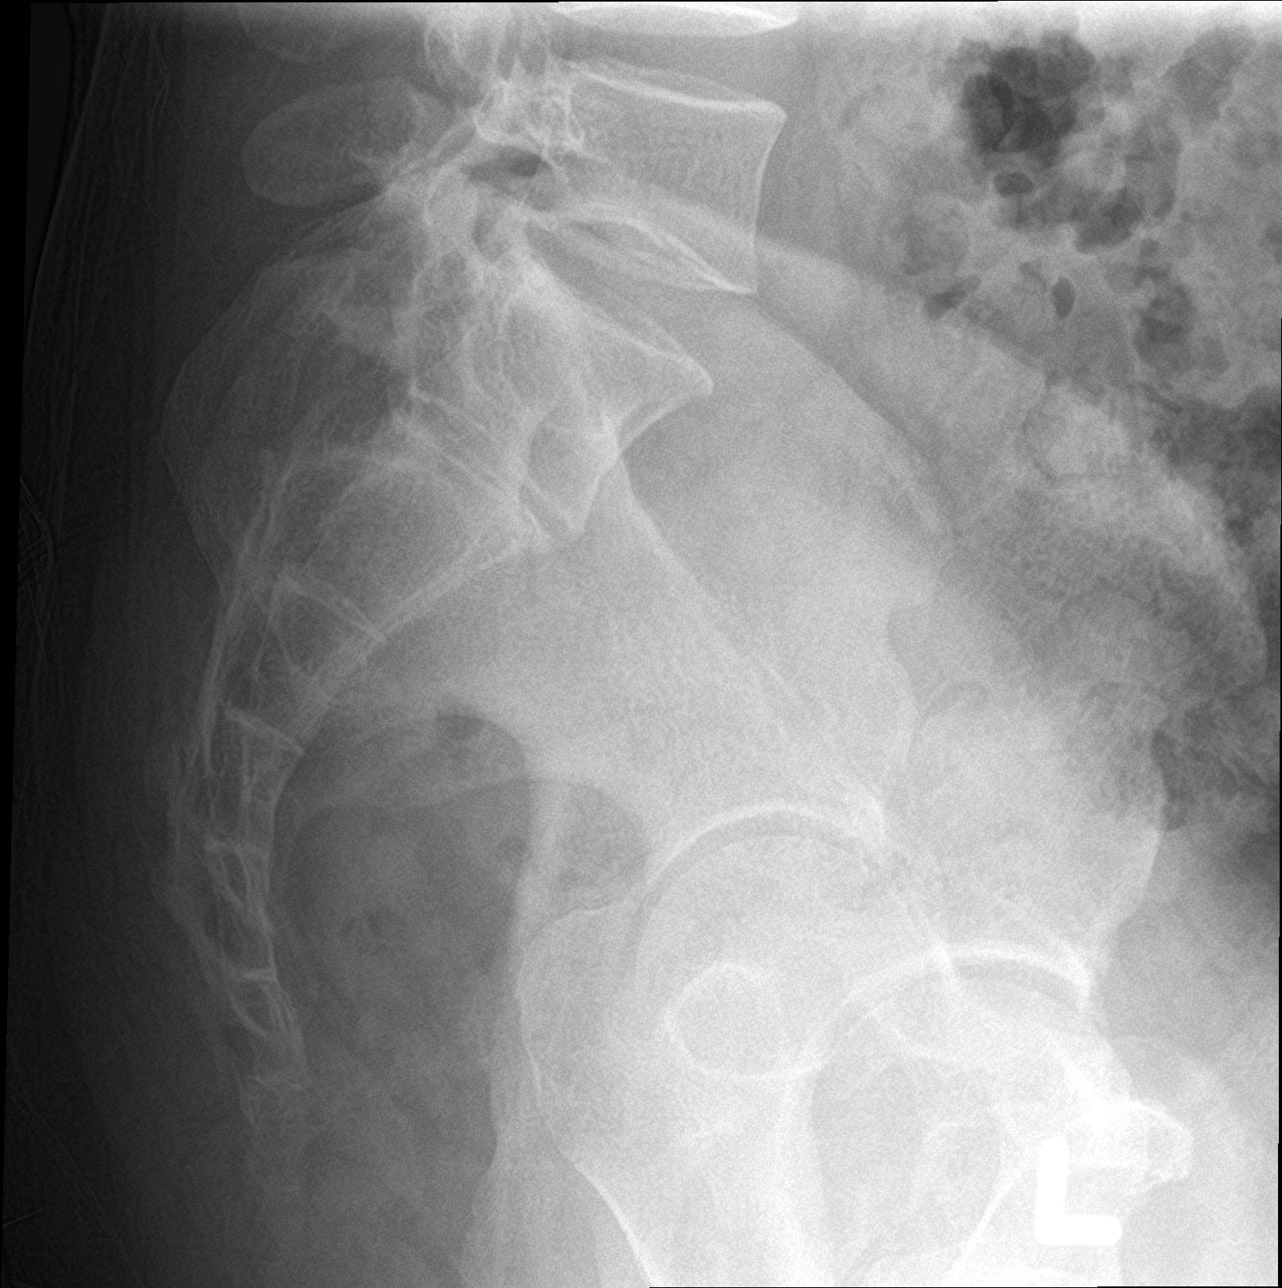

[3 of 3 positions shown; findings below may reference images not displayed]

FINDINGS: There is no evidence of fracture or other focal bone lesions.
IMPRESSION: Negative.

## 2023-04-16 ENCOUNTER — Emergency Department (HOSPITAL_COMMUNITY)
Admission: EM | Admit: 2023-04-16 | Discharge: 2023-04-16 | Disposition: A | Discharge status: Home / Self Care | Payer: Self-pay | Attending: Emergency Medicine | Admitting: Emergency Medicine

## 2023-04-16 ENCOUNTER — Emergency Department (HOSPITAL_COMMUNITY): Payer: Self-pay

## 2023-04-16 DIAGNOSIS — R739 Hyperglycemia, unspecified: Secondary | ICD-10-CM | POA: Insufficient documentation

## 2023-04-16 DIAGNOSIS — R4020 Unspecified coma: Secondary | ICD-10-CM

## 2023-04-16 DIAGNOSIS — M25512 Pain in left shoulder: Secondary | ICD-10-CM

## 2023-04-16 DIAGNOSIS — R55 Syncope and collapse: Secondary | ICD-10-CM | POA: Insufficient documentation

## 2023-04-16 DIAGNOSIS — R0789 Other chest pain: Secondary | ICD-10-CM

## 2023-04-16 LAB — BASIC METABOLIC PANEL
Anion gap: 10 (ref 5–15)
BUN: 13 mg/dL (ref 6–20)
CO2: 22 mmol/L (ref 22–32)
Calcium: 9.3 mg/dL (ref 8.9–10.3)
Chloride: 102 mmol/L (ref 98–111)
Creatinine, Ser: 0.59 mg/dL — ABNORMAL LOW (ref 0.61–1.24)
GFR, Estimated: >60 mL/min (ref >60)
Glucose, Bld: 268 mg/dL — ABNORMAL HIGH (ref 70–99)
Potassium: 4.2 mmol/L (ref 3.5–5.1)
Sodium: 134 mmol/L — ABNORMAL LOW (ref 135–145)

## 2023-04-16 LAB — CBC
HCT: 46.9 % (ref 39.0–52.0)
Hemoglobin: 16.2 g/dL (ref 13.0–17.0)
MCH: 29.8 pg (ref 26.0–34.0)
MCHC: 34.5 g/dL (ref 30.0–36.0)
MCV: 86.4 fL (ref 80.0–100.0)
Platelets: 143 10*3/uL — ABNORMAL LOW (ref 150–400)
RBC: 5.43 MIL/uL (ref 4.22–5.81)
RDW: 12.4 % (ref 11.5–15.5)
WBC: 8.4 10*3/uL (ref 4.0–10.5)
nRBC: 0 % (ref 0.0–0.2)

## 2023-04-16 LAB — TROPONIN I (HIGH SENSITIVITY)
Troponin I (High Sensitivity): 6 ng/L (ref <18)
Troponin I (High Sensitivity): 5 ng/L (ref <18)

## 2023-04-16 LAB — CBG MONITORING, ED: Glucose-Capillary: 245 mg/dL — ABNORMAL HIGH (ref 70–99)

## 2023-04-16 LAB — D-DIMER, QUANTITATIVE: D-Dimer, Quant: <0.27 ug{FEU}/mL (ref 0.00–0.50)

## 2023-04-16 MED ORDER — ACETAMINOPHEN 500 MG PO TABS
1000.0000 mg | ORAL_TABLET | Freq: Once | ORAL | Status: AC
  Administered 2023-04-16: 1000 mg via ORAL
  Filled 2023-04-16: qty 2

## 2023-04-16 NOTE — ED Notes (Signed)
X-ray at bedside

## 2023-04-16 NOTE — ED Notes (Signed)
This RN and PA reviewed discharge instructions with patient utilizing a spanish interpreter. She verbalized understanding and denied any further questions. PT well appearing upon discharge and reports tolerable pain. Pt ambulated with stable gait to exit. Pt endorses ride home.
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
## 2023-04-16 NOTE — Discharge Instructions (Addendum)
Evaluation was overall reassuring.  Recommend you follow-up with your PCP.  If your symptoms worsen in any way please return to the ED for further evaluation.  Recommend ice 3-4 times a day, ibuprofen and Tylenol for treatment of your left shoulder pain.
